# Patient Record
Sex: Female | Born: 1975 | Race: White | Hispanic: No | Marital: Married | Smoking: Former smoker
Health system: Southern US, Community
[De-identification: ages and names within clinical notes are randomized; demographics above are authoritative.]

## PROBLEM LIST (undated history)

## (undated) DIAGNOSIS — Z6839 Body mass index (BMI) 39.0-39.9, adult: Secondary | ICD-10-CM

## (undated) DIAGNOSIS — G43909 Migraine, unspecified, not intractable, without status migrainosus: Secondary | ICD-10-CM

## (undated) DIAGNOSIS — J189 Pneumonia, unspecified organism: Secondary | ICD-10-CM

## (undated) DIAGNOSIS — E039 Hypothyroidism, unspecified: Secondary | ICD-10-CM

## (undated) DIAGNOSIS — F32A Depression, unspecified: Secondary | ICD-10-CM

## (undated) DIAGNOSIS — Z87442 Personal history of urinary calculi: Secondary | ICD-10-CM

## (undated) DIAGNOSIS — F419 Anxiety disorder, unspecified: Secondary | ICD-10-CM

## (undated) DIAGNOSIS — R0601 Orthopnea: Secondary | ICD-10-CM

## (undated) DIAGNOSIS — M25472 Effusion, left ankle: Secondary | ICD-10-CM

## (undated) HISTORY — PX: LAPAROSCOPIC GASTRIC BANDING: SHX1100

## (undated) HISTORY — DX: Effusion, left ankle: M25.472

## (undated) HISTORY — DX: Body mass index (BMI) 39.0-39.9, adult: Z68.39

## (undated) HISTORY — DX: Orthopnea: R06.01

---

## 2020-11-06 NOTE — Progress Notes (Addendum)
COVID Vaccine Completed:  x2 Date COVID Vaccine completed:  Unsure of dates COVID vaccine manufacturer: Pfizer    Moderna   [employer]   PCP - Dr. Venetia Maxon at [facility] in [place] Cardiologist - N/A  Chest x-ray - N/A EKG - N/A Stress Test -  ECHO -  Cardiac Cath -  Pacemaker/ICD device last checked:  Sleep Study -  CPAP -   Fasting Blood Sugar -  Checks Blood Sugar _____ times a day  Blood Thinner Instructions: Aspirin Instructions: Last Dose:  Anesthesia review:    Patient denies shortness of breath, fever, cough and chest pain at PAT appointment.  Pt able to climb a flight of stairs and perform ADLs without assistance.   Patient verbalized understanding of instructions that were given to them at the PAT appointment. Patient was also instructed that they will need to review over the PAT instructions again at home before surgery.

## 2020-11-06 NOTE — Patient Instructions (Addendum)
DUE TO COVID-19 ONLY ONE VISITOR IS ALLOWED IN WAITING ROOM (VISITOR WILL HAVE A TEMPERATURE CHECK ON ARRIVAL AND MUST WEAR A FACE MASK THE ENTIRE TIME.)  ONCE YOU ARE ADMITTED TO YOUR PRIVATE ROOM, THE SAME ONE VISITOR IS ALLOWED TO VISIT DURING VISITING HOURS ONLY.  Your COVID swab testing is scheduled for Saturday, 11-11-20 at 12:10 PM ,   You must self quarantine after your testing per handout given to you at the testing site. [place] [address] [place], [place] [address]  (Must self quarantine after testing. Follow instructions on handout.)       Your procedure is scheduled on:  Wednesday, 11-15-20   Report to [place] [facility] AT 5:30 A. M.   Call this number if you have problems the morning of surgery:[phone].   OUR ADDRESS IS [name].  [place]   [place].                                     REMEMBER:  DO NOT EAT FOOD OR DRINK LIQUIDS AFTER MIDNIGHT .    BRUSH YOUR TEETH THE MORNING OF SURGERY.  TAKE THESE MEDICATIONS MORNING OF SURGERY WITH A SIP OF WATER:  Pepcid, Levothyroxine, Armour thyroid, Zonegran  DO NOT WEAR JEWERLY, MAKE UP, OR NAIL POLISH.  DO NOT WEAR LOTIONS, POWDERS, PERFUMES/COLOGNE OR DEODORANT.  DO NOT SHAVE FOR 24 HOURS PRIOR TO DAY OF SURGERY.  CONTACTS, GLASSES, OR DENTURES MAY NOT BE WORN TO SURGERY.                                    [facility] [facility] IS NOT RESPONSIBLE  FOR ANY BELONGINGS.           Incentive Spirometer  An incentive spirometer is a tool that can help keep your lungs clear and active. This tool measures how well you are filling your lungs with each breath. Taking long deep breaths may help reverse or decrease the chance of developing breathing (pulmonary) problems (especially infection) following:  A long period of time when you are unable to move or be active. BEFORE THE PROCEDURE   If the spirometer includes an indicator to show your best effort, your nurse or respiratory  therapist will set it to a desired goal.  If possible, sit up straight or lean slightly forward. Try not to slouch.  Hold the incentive spirometer in an upright position. INSTRUCTIONS FOR USE  1. Sit on the edge of your bed if possible, or sit up as far as you can in bed or on a chair. 2. Hold the incentive spirometer in an upright position. 3. Breathe out normally. 4. Place the mouthpiece in your mouth and seal your lips tightly around it. 5. Breathe in slowly and as deeply as possible, raising the piston or the ball toward the top of the column. 6. Hold your breath for 3-5 seconds or for as long as possible. Allow the piston or ball to fall to the bottom of the column. 7. Remove the mouthpiece from your mouth and breathe out normally. 8. Rest for a few seconds and repeat Steps 1 through 7 at least 10 times every 1-2 hours when you are awake. Take your time and take a few normal breaths between deep breaths. 9. The spirometer may include  an indicator to show your best effort. Use the indicator as a goal to work toward during each repetition. 10. After each set of 10 deep breaths, practice coughing to be sure your lungs are clear. If you have an incision (the cut made at the time of surgery), support your incision when coughing by placing a pillow or rolled up towels firmly against it. Once you are able to get out of bed, walk around indoors and cough well. You may stop using the incentive spirometer when instructed by your caregiver.  RISKS AND COMPLICATIONS  Take your time so you do not get dizzy or light-headed.  If you are in pain, you may need to take or ask for pain medication before doing incentive spirometry. It is harder to take a deep breath if you are having pain. AFTER USE  Rest and breathe slowly and easily.  It can be helpful to keep track of a log of your progress. Your caregiver can provide you with a simple table to help with this. If you are using the spirometer at home,  follow these instructions: [place] IF:   You are having difficultly using the spirometer.  You have trouble using the spirometer as often as instructed.  Your pain medication is not giving enough relief while using the spirometer.  You develop fever of 100.5 F (38.1 C) or higher. SEEK IMMEDIATE [facility]ARE IF:   You cough up bloody sputum that had not been present before.  You develop fever of 102 F (38.9 C) or greater.  You develop worsening pain at or near the incision site. MAKE SURE YOU:   Understand these instructions.  Will watch your condition.  Will get help right away if you are not doing well or get worse. Document Released: 03/24/2007 Document Revised: 02/03/2012 Document Reviewed: 05/25/2007 [facility] Patient Information 2014 [place], [place].   ________________________________________________________________________                                                      WHAT IS A BLOOD TRANSFUSION? Blood Transfusion Information  A transfusion is the replacement of blood or some of its parts. Blood is made up of multiple cells which provide different functions.  Red blood cells carry oxygen and are used for blood loss replacement.  White blood cells fight against infection.  Platelets control bleeding.  Plasma helps clot blood.  Other blood products are available for specialized needs, such as hemophilia or other clotting disorders. BEFORE THE TRANSFUSION  Who gives blood for transfusions?   Healthy volunteers who are fully evaluated to make sure their blood is safe. This is blood bank blood. Transfusion therapy is the safest it has ever been in the practice of medicine. Before blood is taken from a donor, a complete history is taken to make sure that person has no history of diseases nor engages in risky social behavior (examples are intravenous drug use or sexual activity with multiple partners). The donor's travel history is screened to  minimize risk of transmitting infections, such as malaria. The donated blood is tested for signs of infectious diseases, such as HIV and hepatitis. The blood is then tested to be sure it is compatible with you in order to minimize the chance of a transfusion reaction. If you or a relative donates blood, this is often done  in anticipation of surgery and is not appropriate for emergency situations. It takes many days to process the donated blood. RISKS AND COMPLICATIONS Although transfusion therapy is very safe and saves many lives, the main dangers of transfusion include:   Getting an infectious disease.  Developing a transfusion reaction. This is an allergic reaction to something in the blood you were given. Every precaution is taken to prevent this. The decision to have a blood transfusion has been considered carefully by your caregiver before blood is given. Blood is not given unless the benefits outweigh the risks. AFTER THE TRANSFUSION  Right after receiving a blood transfusion, you will usually feel much better and more energetic. This is especially true if your red blood cells have gotten low (anemic). The transfusion raises the level of the red blood cells which carry oxygen, and this usually causes an energy increase.  The nurse administering the transfusion will monitor you carefully for complications. HOME CARE INSTRUCTIONS  No special instructions are needed after a transfusion. You may find your energy is better. Speak with your caregiver about any limitations on activity for underlying diseases you may have. SEEK [facility] IF:   Your condition is not improving after your transfusion.  You develop redness or irritation at the intravenous (IV) site. SEEK IMMEDIATE [facility] CARE IF:  Any of the following symptoms occur over the next 12 hours:  Shaking chills.  You have a temperature by mouth above 102 F (38.9 C), not controlled by medicine.  Chest, back, or muscle  pain.  People around you feel you are not acting correctly or are confused.  Shortness of breath or difficulty breathing.  Dizziness and fainting.  You get a rash or develop hives.  You have a decrease in urine output.  Your urine turns a dark color or changes to pink, red, or brown. Any of the following symptoms occur over the next 10 days:  You have a temperature by mouth above 102 F (38.9 C), not controlled by medicine.  Shortness of breath.  Weakness after normal activity.  The white part of the eye turns yellow (jaundice).  You have a decrease in the amount of urine or are urinating less often.  Your urine turns a dark color or changes to pink, red, or brown. Document Released: 11/08/2000 Document Revised: 02/03/2012 Document Reviewed: 06/27/2008 [facility] Patient Information 2014 [place], [place].  _______________________________________________________________________

## 2020-11-08 ENCOUNTER — Encounter (HOSPITAL_COMMUNITY)
Admission: RE | Admit: 2020-11-08 | Discharge: 2020-11-08 | Disposition: A | Discharge status: Home / Self Care | Payer: Managed Care, Other (non HMO) | Source: Ambulatory Visit | Attending: Obstetrics and Gynecology | Admitting: Obstetrics and Gynecology

## 2020-11-08 ENCOUNTER — Encounter (HOSPITAL_COMMUNITY): Payer: Self-pay

## 2020-11-08 ENCOUNTER — Other Ambulatory Visit: Payer: Self-pay

## 2020-11-08 DIAGNOSIS — Z01812 Encounter for preprocedural laboratory examination: Secondary | ICD-10-CM | POA: Diagnosis not present

## 2020-11-08 HISTORY — DX: Hypothyroidism, unspecified: E03.9

## 2020-11-08 HISTORY — DX: Depression, unspecified: F32.A

## 2020-11-08 HISTORY — DX: Personal history of urinary calculi: Z87.442

## 2020-11-08 HISTORY — DX: Pneumonia, unspecified organism: J18.9

## 2020-11-08 HISTORY — DX: Migraine, unspecified, not intractable, without status migrainosus: G43.909

## 2020-11-08 HISTORY — DX: Anxiety disorder, unspecified: F41.9

## 2020-11-08 LAB — CBC
HCT: 46 % (ref 36.0–46.0)
Hemoglobin: 15.9 g/dL — ABNORMAL HIGH (ref 12.0–15.0)
MCH: 33.4 pg (ref 26.0–34.0)
MCHC: 34.6 g/dL (ref 30.0–36.0)
MCV: 96.6 fL (ref 80.0–100.0)
Platelets: 321 10*3/uL (ref 150–400)
RBC: 4.76 MIL/uL (ref 3.87–5.11)
RDW: 12.1 % (ref 11.5–15.5)
WBC: 5.9 10*3/uL (ref 4.0–10.5)
nRBC: 0 % (ref 0.0–0.2)

## 2020-11-11 ENCOUNTER — Other Ambulatory Visit (HOSPITAL_COMMUNITY)
Admission: RE | Admit: 2020-11-11 | Discharge: 2020-11-11 | Disposition: A | Discharge status: Home / Self Care | Payer: Managed Care, Other (non HMO) | Source: Ambulatory Visit | Attending: Obstetrics and Gynecology | Admitting: Obstetrics and Gynecology

## 2020-11-11 DIAGNOSIS — Z20822 Contact with and (suspected) exposure to covid-19: Secondary | ICD-10-CM | POA: Insufficient documentation

## 2020-11-11 DIAGNOSIS — Z01812 Encounter for preprocedural laboratory examination: Secondary | ICD-10-CM | POA: Insufficient documentation

## 2020-11-11 LAB — SARS CORONAVIRUS 2 (TAT 6-24 HRS): SARS Coronavirus 2: NEGATIVE

## 2020-11-14 NOTE — Anesthesia Preprocedure Evaluation (Addendum)
Anesthesia Evaluation  Patient identified by MRN, date of birth, ID band Patient awake    Reviewed: Allergy & Precautions, NPO status , Patient's Chart, lab work & pertinent test results  History of Anesthesia Complications Negative for: history of anesthetic complications  Airway Mallampati: II  TM Distance: >3 FB Neck ROM: Full    Dental  (+) Dental Advisory Given   Pulmonary former smoker,    Pulmonary exam normal        Cardiovascular negative cardio ROS Normal cardiovascular exam     Neuro/Psych  Headaches, PSYCHIATRIC DISORDERS Anxiety Depression    GI/Hepatic Neg liver ROS, GERD  Controlled and Medicated,  Endo/Other  Hypothyroidism  Obesity   Renal/GU negative Renal ROS     Musculoskeletal negative musculoskeletal ROS (+)   Abdominal   Peds  Hematology negative hematology ROS (+)   Anesthesia Other Findings Covid test negative   Reproductive/Obstetrics                            Anesthesia Physical Anesthesia Plan  ASA: II  Anesthesia Plan: General   Post-op Pain Management:    Induction: Intravenous  PONV Risk Score and Plan: 4 or greater and Treatment may vary due to age or medical condition, Ondansetron, Midazolam and Dexamethasone  Airway Management Planned: Oral ETT  Additional Equipment: None  Intra-op Plan:   Post-operative Plan: Extubation in OR  Informed Consent: I have reviewed the patients History and Physical, chart, labs and discussed the procedure including the risks, benefits and alternatives for the proposed anesthesia with the patient or authorized representative who has indicated his/her understanding and acceptance.     Dental advisory given  Plan Discussed with: CRNA and Anesthesiologist  Anesthesia Plan Comments:        Anesthesia Quick Evaluation

## 2020-11-14 NOTE — H&P (Signed)
  Gynecology History and Physical  S[place]by James is a 44 y.o. that presented with complaints of pelvic pain and bulk symptoms.  She underwent evaluation at an urgent care and CT revealed fibroid uterus.  Pelvic US was completed in the office and demonstrated the same, specifically fibroids measuring 5.3 x 4.3 cm, 2.4 x 1.9, and 3.3 x 2.9 cm. Adnexae with normal in appearance except for right-sided small simple resolving cyst.  Pain has been intermittent, and is worsened with onset of menses. Menses are heavy and occasionally irregular.   Most recent HGB 15.9 Most recent pap 10/06/2020: NILM with negative HPV  In her twenties, she underwent infertility workup and was never able to get pregnant.  She currently does not desire future childbearing potential. She was offered trial of medical treatment of fibroids, but desires surgical treatment.   She has a pertinent history of laparoscopic gastric banding in 2010, and hypothyroidism well controlled on levothyroxine as well as migraines. She has no known drug allergies and denies alcohol, tobacco, or other drug use.    Past Medical History:  Diagnosis Date  . Anxiety   . Depression   . History of kidney stones    Passed on its own  . Hypothyroidism   . Migraine headache   . Pneumonia    2019    Past Surgical History:  Procedure Laterality Date  . LAPAROSCOPIC GASTRIC BANDING      No family history on file.  Social History:  reports that she has quit smoking. She has a 20.00 pack-year smoking history. She has never used smokeless tobacco. She reports that she does not drink alcohol and does not use drugs.  Allergies: No Known Allergies  No medications prior to admission.    Review of Systems  There were no vitals taken for this visit. Physical Exam From office Visit 11/12/201: General: well appearing, no apparent distress Chest: nonlabored breathing CV: no peripheral edema Abdomen: nondistended, moderate tenderness in RLQ  quadrant. No apparent hernia. No rebound or guarding.  Pelvic: No vulvar lesions, no erythema. No vaginal lesions, no discharge. Cervix normal in appearance. Pap collected. Uterus bulky, size difficult to estimate due to limits with habitus. Midline, RLQ tenderness is elicited with bimanual exam, but not with isolated uterine motion. No adnexal masses palpated.  Chaperone present.  No results found for this or any previous visit (from the past 24 hour(s)).  No results found.  Assessment/Plan: Fibroid Uterus  - With persistent and worsening pelvic pain, heavy menses.  Discussed management of uterine fibroids, including medical and surgical means.  Patient declines medical management and desires hysterectomy.  - Plan for laparoscopic-assisted vaginal hysterectomy with bilateral salpingectomy.  Will retain ovaries if normal appearing. Patient agrees with approach.  Discussed risks of surgery, including but not limited to bleeding, infection, pain, need for revision, need for conversion to open procedure, need for blood transfusion.  - Discussed immediate and at-home recovery in detail in office  Carlyon Shadow 11/14/2020, 8:51 PM

## 2020-11-15 ENCOUNTER — Observation Stay (HOSPITAL_BASED_OUTPATIENT_CLINIC_OR_DEPARTMENT_OTHER): Payer: Managed Care, Other (non HMO) | Admitting: Anesthesiology

## 2020-11-15 ENCOUNTER — Observation Stay (HOSPITAL_BASED_OUTPATIENT_CLINIC_OR_DEPARTMENT_OTHER)
Admission: RE | Admit: 2020-11-15 | Discharge: 2020-11-16 | Disposition: A | Discharge status: Home / Self Care | Payer: Managed Care, Other (non HMO) | Attending: Obstetrics and Gynecology | Admitting: Obstetrics and Gynecology

## 2020-11-15 ENCOUNTER — Encounter (HOSPITAL_BASED_OUTPATIENT_CLINIC_OR_DEPARTMENT_OTHER)
Admission: RE | Disposition: A | Discharge status: Home / Self Care | Payer: Self-pay | Source: Home / Self Care | Attending: Obstetrics and Gynecology

## 2020-11-15 ENCOUNTER — Encounter (HOSPITAL_BASED_OUTPATIENT_CLINIC_OR_DEPARTMENT_OTHER): Payer: Self-pay | Admitting: Obstetrics and Gynecology

## 2020-11-15 DIAGNOSIS — E039 Hypothyroidism, unspecified: Secondary | ICD-10-CM | POA: Diagnosis not present

## 2020-11-15 DIAGNOSIS — N838 Other noninflammatory disorders of ovary, fallopian tube and broad ligament: Secondary | ICD-10-CM | POA: Diagnosis not present

## 2020-11-15 DIAGNOSIS — Z87891 Personal history of nicotine dependence: Secondary | ICD-10-CM | POA: Diagnosis not present

## 2020-11-15 DIAGNOSIS — N800 Endometriosis of uterus: Secondary | ICD-10-CM | POA: Insufficient documentation

## 2020-11-15 DIAGNOSIS — D259 Leiomyoma of uterus, unspecified: Principal | ICD-10-CM | POA: Diagnosis present

## 2020-11-15 DIAGNOSIS — N946 Dysmenorrhea, unspecified: Secondary | ICD-10-CM | POA: Diagnosis present

## 2020-11-15 HISTORY — PX: LAPAROSCOPIC VAGINAL HYSTERECTOMY WITH SALPINGECTOMY: SHX6680

## 2020-11-15 HISTORY — DX: Leiomyoma of uterus, unspecified: D25.9

## 2020-11-15 LAB — ABO/RH: ABO/RH(D): O POS

## 2020-11-15 LAB — TYPE AND SCREEN
ABO/RH(D): O POS
Antibody Screen: NEGATIVE

## 2020-11-15 LAB — POCT PREGNANCY, URINE: Preg Test, Ur: NEGATIVE

## 2020-11-15 SURGERY — HYSTERECTOMY, VAGINAL, LAPAROSCOPY-ASSISTED, WITH SALPINGECTOMY
Anesthesia: General | Site: Abdomen | Laterality: Bilateral

## 2020-11-15 MED ORDER — SODIUM CHLORIDE (PF) 0.9 % IJ SOLN
INTRAMUSCULAR | Status: AC
  Filled 2020-11-15: qty 10

## 2020-11-15 MED ORDER — GABAPENTIN 100 MG PO CAPS
ORAL_CAPSULE | ORAL | Status: AC
  Filled 2020-11-15: qty 1

## 2020-11-15 MED ORDER — OXYCODONE HCL 5 MG PO TABS
ORAL_TABLET | ORAL | Status: AC
  Filled 2020-11-15: qty 1

## 2020-11-15 MED ORDER — OXYCODONE HCL 5 MG PO TABS
ORAL_TABLET | ORAL | Status: AC
  Filled 2020-11-15: qty 2

## 2020-11-15 MED ORDER — PROPOFOL 10 MG/ML IV BOLUS
INTRAVENOUS | Status: DC | PRN
  Administered 2020-11-15: 200 mg via INTRAVENOUS

## 2020-11-15 MED ORDER — FENTANYL CITRATE (PF) 100 MCG/2ML IJ SOLN
INTRAMUSCULAR | Status: AC
  Filled 2020-11-15: qty 2

## 2020-11-15 MED ORDER — SCOPOLAMINE 1 MG/3DAYS TD PT72
1.0000 | MEDICATED_PATCH | TRANSDERMAL | Status: DC
  Administered 2020-11-15: 12:00:00 1.5 mg via TRANSDERMAL

## 2020-11-15 MED ORDER — ONDANSETRON HCL 4 MG/2ML IJ SOLN
INTRAMUSCULAR | Status: DC | PRN
  Administered 2020-11-15: 4 mg via INTRAVENOUS

## 2020-11-15 MED ORDER — HYDROMORPHONE HCL 1 MG/ML IJ SOLN: 0.5000 mg | INTRAMUSCULAR | Status: DC | PRN

## 2020-11-15 MED ORDER — DOCUSATE SODIUM 100 MG PO CAPS
100.0000 mg | ORAL_CAPSULE | Freq: Two times a day (BID) | ORAL | Status: DC
  Administered 2020-11-15: 22:00:00 100 mg via ORAL

## 2020-11-15 MED ORDER — ONDANSETRON HCL 4 MG PO TABS: 4.0000 mg | ORAL_TABLET | ORAL | Status: DC | PRN

## 2020-11-15 MED ORDER — CEFAZOLIN SODIUM-DEXTROSE 2-4 GM/100ML-% IV SOLN
2.0000 g | INTRAVENOUS | Status: AC
  Administered 2020-11-15: 08:00:00 2 g via INTRAVENOUS

## 2020-11-15 MED ORDER — MIDAZOLAM HCL 2 MG/2ML IJ SOLN
INTRAMUSCULAR | Status: DC | PRN
  Administered 2020-11-15: 2 mg via INTRAVENOUS

## 2020-11-15 MED ORDER — DEXMEDETOMIDINE (PRECEDEX) IN NS 20 MCG/5ML (4 MCG/ML) IV SYRINGE
PREFILLED_SYRINGE | INTRAVENOUS | Status: DC | PRN
  Administered 2020-11-15 (×4): 4 ug via INTRAVENOUS

## 2020-11-15 MED ORDER — DOCUSATE SODIUM 100 MG PO CAPS
ORAL_CAPSULE | ORAL | Status: AC
  Filled 2020-11-15: qty 1

## 2020-11-15 MED ORDER — VASOPRESSIN 20 UNIT/ML IV SOLN
INTRAVENOUS | Status: AC
  Filled 2020-11-15: qty 1

## 2020-11-15 MED ORDER — DEXAMETHASONE SODIUM PHOSPHATE 10 MG/ML IJ SOLN
INTRAMUSCULAR | Status: DC | PRN
  Administered 2020-11-15 (×2): 5 mg via INTRAVENOUS

## 2020-11-15 MED ORDER — PROMETHAZINE HCL 25 MG/ML IJ SOLN
6.2500 mg | INTRAMUSCULAR | Status: AC | PRN
Start: 2020-11-15 — End: 2020-11-15
  Administered 2020-11-15 (×2): 6.25 mg via INTRAVENOUS

## 2020-11-15 MED ORDER — CHLORHEXIDINE GLUCONATE 0.12 % MT SOLN: 15.0000 mL | Freq: Once | OROMUCOSAL | Status: DC

## 2020-11-15 MED ORDER — KETOROLAC TROMETHAMINE 30 MG/ML IJ SOLN
INTRAMUSCULAR | Status: DC | PRN
  Administered 2020-11-15: 30 mg via INTRAVENOUS

## 2020-11-15 MED ORDER — LACTATED RINGERS IV SOLN: INTRAVENOUS | Status: DC

## 2020-11-15 MED ORDER — ROCURONIUM BROMIDE 10 MG/ML (PF) SYRINGE
PREFILLED_SYRINGE | INTRAVENOUS | Status: AC
  Filled 2020-11-15: qty 10

## 2020-11-15 MED ORDER — SIMETHICONE 80 MG PO CHEW
80.0000 mg | CHEWABLE_TABLET | Freq: Four times a day (QID) | ORAL | Status: DC | PRN
  Administered 2020-11-15: 22:00:00 80 mg via ORAL

## 2020-11-15 MED ORDER — VASOPRESSIN 20 UNIT/ML IV SOLN
INTRAVENOUS | Status: DC | PRN
  Administered 2020-11-15: 09:00:00 10 mL via INTRAMUSCULAR

## 2020-11-15 MED ORDER — AMISULPRIDE (ANTIEMETIC) 5 MG/2ML IV SOLN
10.0000 mg | Freq: Once | INTRAVENOUS | Status: AC
  Administered 2020-11-15: 11:00:00 10 mg via INTRAVENOUS

## 2020-11-15 MED ORDER — MIDAZOLAM HCL 2 MG/2ML IJ SOLN
INTRAMUSCULAR | Status: AC
  Filled 2020-11-15: qty 2

## 2020-11-15 MED ORDER — KETOROLAC TROMETHAMINE 30 MG/ML IJ SOLN
INTRAMUSCULAR | Status: AC
  Filled 2020-11-15: qty 1

## 2020-11-15 MED ORDER — FENTANYL CITRATE (PF) 100 MCG/2ML IJ SOLN
INTRAMUSCULAR | Status: DC | PRN
  Administered 2020-11-15: 25 ug via INTRAVENOUS
  Administered 2020-11-15 (×2): 50 ug via INTRAVENOUS
  Administered 2020-11-15 (×2): 25 ug via INTRAVENOUS

## 2020-11-15 MED ORDER — WHITE PETROLATUM EX OINT
TOPICAL_OINTMENT | CUTANEOUS | Status: AC
  Filled 2020-11-15: qty 5

## 2020-11-15 MED ORDER — MENTHOL 3 MG MT LOZG: 1.0000 | LOZENGE | OROMUCOSAL | Status: DC | PRN

## 2020-11-15 MED ORDER — ALUM & MAG HYDROXIDE-SIMETH 200-200-20 MG/5ML PO SUSP: 30.0000 mL | ORAL | Status: DC | PRN

## 2020-11-15 MED ORDER — PROMETHAZINE HCL 25 MG/ML IJ SOLN
INTRAMUSCULAR | Status: AC
  Filled 2020-11-15: qty 1

## 2020-11-15 MED ORDER — PROPOFOL 10 MG/ML IV BOLUS
INTRAVENOUS | Status: AC
  Filled 2020-11-15: qty 20

## 2020-11-15 MED ORDER — CEFAZOLIN SODIUM-DEXTROSE 2-4 GM/100ML-% IV SOLN
INTRAVENOUS | Status: AC
  Filled 2020-11-15: qty 100

## 2020-11-15 MED ORDER — OXYCODONE HCL 5 MG/5ML PO SOLN: 5.0000 mg | Freq: Once | ORAL | Status: DC | PRN

## 2020-11-15 MED ORDER — ROCURONIUM BROMIDE 10 MG/ML (PF) SYRINGE
PREFILLED_SYRINGE | INTRAVENOUS | Status: DC | PRN
  Administered 2020-11-15: 20 mg via INTRAVENOUS
  Administered 2020-11-15: 50 mg via INTRAVENOUS
  Administered 2020-11-15 (×2): 20 mg via INTRAVENOUS
  Administered 2020-11-15: 10 mg via INTRAVENOUS

## 2020-11-15 MED ORDER — ONDANSETRON HCL 4 MG/2ML IJ SOLN
INTRAMUSCULAR | Status: AC
  Filled 2020-11-15: qty 2

## 2020-11-15 MED ORDER — TRAMADOL HCL 50 MG PO TABS: 50.0000 mg | ORAL_TABLET | Freq: Four times a day (QID) | ORAL | Status: DC | PRN

## 2020-11-15 MED ORDER — OXYCODONE HCL 5 MG PO TABS: 5.0000 mg | ORAL_TABLET | Freq: Once | ORAL | Status: DC | PRN

## 2020-11-15 MED ORDER — DEXTROSE-NACL 5-0.45 % IV SOLN: INTRAVENOUS | Status: DC

## 2020-11-15 MED ORDER — LIDOCAINE HCL (PF) 2 % IJ SOLN
INTRAMUSCULAR | Status: AC
  Filled 2020-11-15: qty 5

## 2020-11-15 MED ORDER — SIMETHICONE 80 MG PO CHEW
CHEWABLE_TABLET | ORAL | Status: AC
  Filled 2020-11-15: qty 1

## 2020-11-15 MED ORDER — FENTANYL CITRATE (PF) 100 MCG/2ML IJ SOLN
25.0000 ug | INTRAMUSCULAR | Status: DC | PRN
Start: 2020-11-15 — End: 2020-11-16
  Administered 2020-11-15: 25 ug via INTRAVENOUS

## 2020-11-15 MED ORDER — GABAPENTIN 100 MG PO CAPS
100.0000 mg | ORAL_CAPSULE | Freq: Three times a day (TID) | ORAL | Status: DC
  Administered 2020-11-15 (×2): 100 mg via ORAL

## 2020-11-15 MED ORDER — POVIDONE-IODINE 10 % EX SWAB: 2.0000 "application " | Freq: Once | CUTANEOUS | Status: DC

## 2020-11-15 MED ORDER — AMISULPRIDE (ANTIEMETIC) 5 MG/2ML IV SOLN
INTRAVENOUS | Status: AC
  Filled 2020-11-15: qty 2

## 2020-11-15 MED ORDER — ZOLPIDEM TARTRATE 5 MG PO TABS: 5.0000 mg | ORAL_TABLET | Freq: Every evening | ORAL | Status: DC | PRN

## 2020-11-15 MED ORDER — ORAL CARE MOUTH RINSE: 15.0000 mL | Freq: Once | OROMUCOSAL | Status: DC

## 2020-11-15 MED ORDER — LIDOCAINE 2% (20 MG/ML) 5 ML SYRINGE
INTRAMUSCULAR | Status: DC | PRN
  Administered 2020-11-15: 60 mg via INTRAVENOUS

## 2020-11-15 MED ORDER — DEXAMETHASONE SODIUM PHOSPHATE 10 MG/ML IJ SOLN
INTRAMUSCULAR | Status: AC
  Filled 2020-11-15: qty 1

## 2020-11-15 MED ORDER — OXYCODONE HCL 5 MG PO TABS
5.0000 mg | ORAL_TABLET | ORAL | Status: DC | PRN
  Administered 2020-11-15: 5 mg via ORAL
  Administered 2020-11-15: 10 mg via ORAL
  Administered 2020-11-15 (×2): 5 mg via ORAL
  Administered 2020-11-16 (×2): 10 mg via ORAL

## 2020-11-15 MED ORDER — SCOPOLAMINE 1 MG/3DAYS TD PT72
MEDICATED_PATCH | TRANSDERMAL | Status: AC
  Filled 2020-11-15: qty 1

## 2020-11-15 MED ORDER — SUGAMMADEX SODIUM 200 MG/2ML IV SOLN
INTRAVENOUS | Status: DC | PRN
  Administered 2020-11-15: 180 mg via INTRAVENOUS

## 2020-11-15 MED ORDER — DEXMEDETOMIDINE (PRECEDEX) IN NS 20 MCG/5ML (4 MCG/ML) IV SYRINGE
PREFILLED_SYRINGE | INTRAVENOUS | Status: AC
  Filled 2020-11-15: qty 5

## 2020-11-15 MED ORDER — BUPIVACAINE HCL (PF) 0.5 % IJ SOLN
INTRAMUSCULAR | Status: DC | PRN
  Administered 2020-11-15: 7 mL

## 2020-11-15 SURGICAL SUPPLY — 59 items
BLADE SURG SZ10 CARB STEEL (BLADE) ×2 IMPLANT
CABLE HIGH FREQUENCY MONO STRZ (ELECTRODE) IMPLANT
CATH ROBINSON RED A/P 16FR (CATHETERS) ×1 IMPLANT
COVER BACK TABLE 60X90IN (DRAPES) ×3 IMPLANT
COVER MAYO STAND STRL (DRAPES) ×3 IMPLANT
DECANTER SPIKE VIAL GLASS SM (MISCELLANEOUS) ×3 IMPLANT
DERMABOND ADVANCED (GAUZE/BANDAGES/DRESSINGS) ×2
DERMABOND ADVANCED .7 DNX12 (GAUZE/BANDAGES/DRESSINGS) ×1 IMPLANT
DRSG OPSITE POSTOP 3X4 (GAUZE/BANDAGES/DRESSINGS) ×3 IMPLANT
DURAPREP 26ML APPLICATOR (WOUND CARE) ×3 IMPLANT
ELECT REM PT RETURN 9FT ADLT (ELECTROSURGICAL) ×3
ELECTRODE REM PT RTRN 9FT ADLT (ELECTROSURGICAL) IMPLANT
FILTER SMOKE EVAC LAPAROSHD (FILTER) ×1 IMPLANT
GLOVE BIO SURGEON STRL SZ7 (GLOVE) ×2 IMPLANT
GLOVE BIOGEL PI IND STRL 7.0 (GLOVE) ×2 IMPLANT
GLOVE BIOGEL PI INDICATOR 7.0 (GLOVE) ×6
GLOVE ECLIPSE 6.5 STRL STRAW (GLOVE) ×2 IMPLANT
GLOVE INDICATOR 7.5 STRL GRN (GLOVE) ×3 IMPLANT
GLOVE SURG GAMMEX PI TX LF 6.5 (GLOVE) ×2 IMPLANT
GLOVE SURG LTX SZ7 (GLOVE) ×6 IMPLANT
GLOVE SURG SS PI 7.0 STRL IVOR (GLOVE) ×2 IMPLANT
GLOVE SURG SS PI 7.5 STRL IVOR (GLOVE) ×2 IMPLANT
GLOVE SURG UNDER POLY LF SZ6.5 (GLOVE) ×3 IMPLANT
GOWN STRL REUS W/ TWL LRG LVL3 (GOWN DISPOSABLE) IMPLANT
GOWN STRL REUS W/ TWL XL LVL3 (GOWN DISPOSABLE) ×2 IMPLANT
GOWN STRL REUS W/TWL LRG LVL3 (GOWN DISPOSABLE) ×12 IMPLANT
GOWN STRL REUS W/TWL XL LVL3 (GOWN DISPOSABLE)
IV CATH 18GX1.25 SAFE RETR GRN (IV SOLUTION) ×2 IMPLANT
KIT TURNOVER CYSTO (KITS) ×3 IMPLANT
LEGGING LITHOTOMY PAIR STRL (DRAPES) ×3 IMPLANT
LIGASURE BLUNT TIP 5 LONG 44CM (ELECTROSURGICAL) ×2 IMPLANT
LIGASURE IMPACT 36 18CM CVD LR (INSTRUMENTS) IMPLANT
LIGASURE VESSEL 5MM BLUNT TIP (ELECTROSURGICAL) ×3 IMPLANT
MARKER PEN SURG W/LABELS BLK (STERILIZATION PRODUCTS) ×2 IMPLANT
NS IRRIG 1000ML POUR BTL (IV SOLUTION) ×1 IMPLANT
NS IRRIG 500ML POUR BTL (IV SOLUTION) ×2 IMPLANT
PACK LAVH (CUSTOM PROCEDURE TRAY) ×3 IMPLANT
PACK TRENDGUARD 450 HYBRID PRO (MISCELLANEOUS) IMPLANT
PROTECTOR NERVE ULNAR (MISCELLANEOUS) ×2 IMPLANT
SCISSORS LAP 5X45 EPIX DISP (ENDOMECHANICALS) IMPLANT
SET CYSTO W/LG BORE CLAMP LF (SET/KITS/TRAYS/PACK) ×2 IMPLANT
SET IRRIG TUBING LAPAROSCOPIC (IRRIGATION / IRRIGATOR) IMPLANT
SET TUBE SMOKE EVAC HIGH FLOW (TUBING) ×3 IMPLANT
SPECIMEN JAR MEDIUM (MISCELLANEOUS) ×1 IMPLANT
SUT MNCRL 0 MO-4 VIOLET 18 CR (SUTURE) ×1 IMPLANT
SUT MNCRL 0 VIOLET 6X18 (SUTURE) ×1 IMPLANT
SUT MON AB 4-0 PS1 27 (SUTURE) ×3 IMPLANT
SUT MONOCRYL 0 6X18 (SUTURE)
SUT MONOCRYL 0 MO 4 18  CR/8 (SUTURE) ×4
SUT VIC AB 0 CT1 36 (SUTURE) ×2 IMPLANT
SUT VICRYL 0 UR6 27IN ABS (SUTURE) ×3 IMPLANT
SYR 5ML LL (SYRINGE) ×2 IMPLANT
TOWEL OR 17X26 10 PK STRL BLUE (TOWEL DISPOSABLE) ×4 IMPLANT
TRAY FOLEY W/BAG SLVR 14FR (SET/KITS/TRAYS/PACK) ×3 IMPLANT
TRENDGUARD 450 HYBRID PRO PACK (MISCELLANEOUS) ×3
TROCAR XCEL NON-BLD 11X100MML (ENDOMECHANICALS) ×3 IMPLANT
TROCAR XCEL NON-BLD 5MMX100MML (ENDOMECHANICALS) ×5 IMPLANT
UNDERPAD 30X36 HEAVY ABSORB (UNDERPADS AND DIAPERS) ×3 IMPLANT
WARMER LAPAROSCOPE (MISCELLANEOUS) ×3 IMPLANT

## 2020-11-15 NOTE — Transfer of Care (Signed)
Immediate Anesthesia Transfer of Care Note  Patient: [place] James  Procedure(s) Performed: Procedure(s) (LRB): LAPAROSCOPIC ASSISTED VAGINAL HYSTERECTOMY WITH BILATERAL SALPINGECTOMY (Bilateral)  Patient Location: PACU  Anesthesia Type: General  Level of Consciousness: awake, oriented, sedated and patient cooperative  Airway & Oxygen Therapy: Patient Spontanous Breathing and Patient connected to face mask oxygen  Post-op Assessment: Report given to PACU RN and Post -op Vital signs reviewed and stable  Post vital signs: Reviewed and stable  Complications: No apparent anesthesia complications Last Vitals:  Vitals Value Taken Time  BP 107/64 11/15/20 1015  Temp    Pulse 80 11/15/20 1016  Resp 13 11/15/20 1015  SpO2 98 % 11/15/20 1016  Vitals shown include unvalidated device data.  Last Pain:  Vitals:   11/15/20 0609  TempSrc: Oral  PainSc: 0-No pain      Patients Stated Pain Goal: 3 (67/54/49 2010)  Complications: No complications documented.

## 2020-11-15 NOTE — H&P (Signed)
Gynecology History and Physical  There are no changes to H&P.  Nela Gander is a 44 y.o. that presented with complaints of pelvic pain and bulk symptoms.  She underwent evaluation at an urgent care and CT revealed fibroid uterus.  Pelvic US was completed in the office and demonstrated the same, specifically fibroids measuring 5.3 x 4.3 cm, 2.4 x 1.9, and 3.3 x 2.9 cm. Adnexae with normal in appearance except for right-sided small simple resolving cyst.  Pain has been intermittent, and is worsened with onset of menses. Menses are heavy and occasionally irregular.   Most recent HGB 15.9 Most recent pap 10/06/2020: NILM with negative HPV  In her twenties, she underwent infertility workup and was never able to get pregnant.  She currently does not desire future childbearing potential. She was offered trial of medical treatment of fibroids, but desires surgical treatment.   She has a pertinent history of laparoscopic gastric banding in 2010, and hypothyroidism well controlled on levothyroxine as well as migraines. She has no known drug allergies and denies alcohol, tobacco, or other drug use.    Past Medical History:  Diagnosis Date  . Anxiety   . Depression   . History of kidney stones    Passed on its own  . Hypothyroidism   . Migraine headache   . Pneumonia    2019    Past Surgical History:  Procedure Laterality Date  . LAPAROSCOPIC GASTRIC BANDING      History reviewed. No pertinent family history.  Social History:  reports that she has quit smoking. She has a 20.00 pack-year smoking history. She has never used smokeless tobacco. She reports that she does not drink alcohol and does not use drugs.  Allergies: No Known Allergies  Medications Prior to Admission  Medication Sig Dispense Refill Last Dose  . amitriptyline (ELAVIL) 10 MG tablet Take 10 mg by mouth at bedtime.   11/14/2020 at Unknown time  . diazepam (VALIUM) 10 MG tablet Take 10 mg by mouth 2 (two) times daily as  needed for anxiety.   11/14/2020 at Unknown time  . escitalopram (LEXAPRO) 20 MG tablet Take 20 mg by mouth at bedtime.   11/15/2020 at 0430  . famotidine (PEPCID) 40 MG tablet Take 40 mg by mouth daily as needed.   Past Month at Unknown time  . levothyroxine (SYNTHROID) 150 MCG tablet Take 150 mcg by mouth daily before breakfast.   11/15/2020 at 0430  . rizatriptan (MAXALT) 10 MG tablet Take 10 mg by mouth 2 (two) times daily as needed for migraine.   Past Month at Unknown time  . thyroid (ARMOUR) 60 MG tablet Take 60 mg by mouth daily before breakfast.   11/15/2020 at 0430  . zonisamide (ZONEGRAN) 50 MG capsule Take 50 mg by mouth daily.   11/15/2020 at 0430    Review of Systems  Blood pressure 121/80, pulse 79, temperature (!) 97.5 F (36.4 C), temperature source Oral, resp. rate 16, height 5\' 3"  (1.6 m), weight 89.4 kg, last menstrual period 11/07/2020, SpO2 98 %. Physical Exam From office Visit 11/12/201: General: well appearing, no apparent distress Chest: nonlabored breathing CV: no peripheral edema Abdomen: nondistended, moderate tenderness in RLQ quadrant. No apparent hernia. No rebound or guarding.  Pelvic: No vulvar lesions, no erythema. No vaginal lesions, no discharge. Cervix normal in appearance. Pap collected. Uterus bulky, size difficult to estimate due to limits with habitus. Midline, RLQ tenderness is elicited with bimanual exam, but not with isolated uterine motion. No  adnexal masses palpated.  Chaperone present.  Results for orders placed or performed during the hospital encounter of 11/15/20 (from the past 24 hour(s))  Pregnancy, urine POC     Status: None   Collection Time: 11/15/20  5:54 AM  Result Value Ref Range   Preg Test, Ur NEGATIVE NEGATIVE    No results found.  Assessment/Plan: Fibroid Uterus  - With persistent and worsening pelvic pain, heavy menses.  Discussed management of uterine fibroids, including medical and surgical means.  Patient declines  medical management and desires hysterectomy.  - Plan for laparoscopic-assisted vaginal hysterectomy with bilateral salpingectomy.  Will retain ovaries if normal appearing. Patient agrees with approach.  Discussed risks of surgery, including but not limited to bleeding, infection, pain, need for revision, need for conversion to open procedure, need for blood transfusion.  - Discussed immediate and at-home recovery in detail in office  Carlyon Shadow 11/15/2020, 7:07 AM

## 2020-11-15 NOTE — Anesthesia Procedure Notes (Signed)
Procedure Name: Intubation Date/Time: 11/15/2020 7:37 AM Performed by: Suan Halter, CRNA Pre-anesthesia Checklist: Patient identified, Emergency Drugs available, Suction available and Patient being monitored Patient Re-evaluated:Patient Re-evaluated prior to induction Oxygen Delivery Method: Circle system utilized Preoxygenation: Pre-oxygenation with 100% oxygen Induction Type: IV induction Ventilation: Mask ventilation without difficulty Laryngoscope Size: Mac and 3 Grade View: Grade I Tube type: Oral Tube size: 7.0 mm Number of attempts: 1 Airway Equipment and Method: Stylet and Oral airway Placement Confirmation: ETT inserted through vocal cords under direct vision,  positive ETCO2 and breath sounds checked- equal and bilateral Secured at: 22 cm Tube secured with: Tape Dental Injury: Teeth and Oropharynx as per pre-operative assessment

## 2020-11-15 NOTE — Op Note (Signed)
OPERATIVE NOTE  PREOPERATIVE DIAGNOSES: 1. Fibroid uterus 2. Dysmenorrhea 3. Abnormal uterine bleeding  POSTOPERATIVE DIAGNOSES: 1. Same  PROCEDURE PERFORMED: Laparoscopic-assisted total vaginal hysterectomy with bilateral salpingectomy  SURGEON: Alpha Gula, MD ASSISTANT: Molli Posey, MD  ANESTHESIA: General   ESTIMATED BLOOD LOSS: 100 cc.  URINE OUTPUT: 200 cc of clear urine at the end of the procedure.  FLUIDS: 1200 cc of crystalloids.  COMPLICATIONS: None   TUBES: None.  DRAINS: Foley to gravity  PATHOLOGY: Uterus, cervix, bilateral fallopian tubes, were sent to pathology for review.  FINDINGS: On exam, under anesthesia, normal appearing vulva and vagina, uterus with multiple fibroids, the largest of which located in the posterior lower uterine segment. The bowel and omentum were normal appearing.  Procedure: A general anesthesia was induced and the patient was placed in the dirsal lithotomy position. The abdomen, perineum, and vagina were prepped and draped in the usual fashion. A foley catheter inserted into the bladder and attached to straight drainage. After the initial preparation, the procedure commenced at the vagina. With a speculum in place to visualize the cervix, the cervix was grasped and a jacobs tenaculum was placed within the uterine cavity for manipulation purposes being careful not to puncture the uterus. Attention was then turned to the abdomen.    Following infiltration with Marcaine, an infraumbilical incision was made and the entry was completed with direct visualization of 10 mm camera and trocar. With CO2 infiltration, visualization of the peritoneal cavity was then obtained and a brief inspection did not reveal any signs of complications from entry. Under direct observation, 46m suprapubic port was placed taking care to respect anatomical landmarks and vessels. Once the placement of the port was complete, the actual laparoscopic procedure began.    Beginning on the right side and distally along the length of the fallopean tube, the mesosalpinx was exposed by lifting the tube up towards the anterior abdominal wall. The mesosalpinx was then sequentially, clamped, ligated, and cut using the enseal working alongside the length of the tube and towards the cornua. This was left in place attached to cornua. The same process was repeated on the left, sequentially clamping, ligating, and cutting the mesosalpinx being sure to not injure the adjacent ovarian tissue or other surrounding structures. The round ligament was then ligated and cut. Following this, the anterior leaf of the broad ligament was then taken down on the left side, dissecting down towards the peritoneal reflection at the base of the bladder and adjacent to the cervix. The same process was then repeated on the left side such that both sides met and the anterior leaflet had been appropriately skeletonized.  Attention was then turned to the vagina. Jacobs tenaculum was removed.  A weighted speculum was placed in the posterior vaginal vault. The cervix was grasped with a teneculum on both its anterior and posterior lips. 20cc of pitressin was injected. With downward traction, we made a circumferential incision of the vaginal mucosa. This allowed dissection and entrance into the posterior cul-de-sac. Peritoneal entry was somewhat challenging due to positioon of large fibroid in the posterior lower uterine segment. The posterior peritoneum was sutured to the posterior vaginal cuff. At this time we visualized the uterosacral ligaments. These were clamped and ligated with #0 Monocryl suture. The cervicovesical space was then created by both blunt and sharp dissection, allowing uKoreato see the cardinal ligaments. These were clamped and ligated with #0 Monocryl suture as well. Once in the cervicovesical space, we were then able to completely  ligate the uterosacral-cardinal ligament complex with #0 Vicryl  suture. The anterior cul-de-sac was then entered sharply. Omentum and intestines were then visualized through the anterior cul-de-sac window and we began removal of the uterine body. The uterine arteries were then clamped and ligated with #0 Vicryl suture and carried upward toward the uterus until complete removal was obtained. Number 0 Monocryl suture was used on all major pedicles with Heaney clamping until the uterus was completely removed with the bilateral fallopian tubes and cervix. The right fallopian tube became detached from cornua during removal but was provided for specimen intact.  We then removed the weighted duckbill speculum and placed a regular speculum in the vaginal vault and visualized the entire area. We inspected for hemostasis, and this was secured. A modified McCall's culdoplasty was performed by placing a stitch through the uterosacral ligament on the left, purstringing the posterior peritoneum and then placing a stitch through the right uterosacral ligament and tying it all together. Hemostasis was again noted. The vaginal cuff was closed with #0 vicryl in a running fashion.   Attention then turned to the abdomen where all of the pedicles were noted to be hemostatic and cuff intact from above. Hemostasis was achieved - and confirmed on low flow. All gas removed from abdomen and all instruments were removed. Fascia was closed with 0' vicryl and skin closed with 4'0 monocryl. The sponge and lap counts were correct times 2 at this time.   The patient's procedure was terminated. We then awakened her. She was sent to the Recovery Room in good condition.

## 2020-11-15 NOTE — Brief Op Note (Signed)
11/15/2020  10:06 AM  PATIENT:  Shelby James  44 y.o. female  PRE-OPERATIVE DIAGNOSIS:  DYSMENORRHEA  POST-OPERATIVE DIAGNOSIS:  DYSMENORRHEA  PROCEDURE:  Procedure(s): LAPAROSCOPIC ASSISTED VAGINAL HYSTERECTOMY WITH BILATERAL SALPINGECTOMY (Bilateral)  SURGEON:  Surgeon(s) and Role:    Mardelle Matte, Michell Heinrich, MD - Primary    * Molli Posey, MD - Assisting   ANESTHESIA:   general  EBL:  100 mL   BLOOD ADMINISTERED:none  DRAINS: none   LOCAL MEDICATIONS USED:  BUPIVICAINE   SPECIMEN:  Uterine, cervix bilateral fallopian tubes  DISPOSITION OF SPECIMEN:  PATHOLOGY  COUNTS:  YES  TOURNIQUET:  * No tourniquets in log *  DICTATION: .Note written in EPIC  PLAN OF CARE: Admit for overnight observation  PATIENT DISPOSITION:  PACU - hemodynamically stable.   Delay start of Pharmacological VTE agent (>24hrs) due to surgical blood loss or risk of bleeding: not applicable

## 2020-11-15 NOTE — Anesthesia Postprocedure Evaluation (Signed)
Anesthesia Post Note  Patient: Rufus Popovich  Procedure(s) Performed: LAPAROSCOPIC ASSISTED VAGINAL HYSTERECTOMY WITH BILATERAL SALPINGECTOMY (Bilateral Abdomen)     Patient location during evaluation: PACU Anesthesia Type: General Level of consciousness: awake and alert Pain management: pain level controlled Vital Signs Assessment: post-procedure vital signs reviewed and stable Respiratory status: spontaneous breathing, nonlabored ventilation and respiratory function stable Cardiovascular status: blood pressure returned to baseline and stable Postop Assessment: no apparent nausea or vomiting Anesthetic complications: no   No complications documented.  Last Vitals:  Vitals:   11/15/20 1015 11/15/20 1030  BP: 107/64 113/62  Pulse: 75 67  Resp: 18 20  Temp: 36.5 C   SpO2: 99% 97%    Last Pain:  Vitals:   11/15/20 1045  TempSrc:   PainSc: Onley Silverio Hagan

## 2020-11-16 DIAGNOSIS — D259 Leiomyoma of uterus, unspecified: Secondary | ICD-10-CM | POA: Diagnosis not present

## 2020-11-16 LAB — CBC
HCT: 37.2 % (ref 36.0–46.0)
Hemoglobin: 12.9 g/dL (ref 12.0–15.0)
MCH: 34 pg (ref 26.0–34.0)
MCHC: 34.7 g/dL (ref 30.0–36.0)
MCV: 98.2 fL (ref 80.0–100.0)
Platelets: 293 10*3/uL (ref 150–400)
RBC: 3.79 MIL/uL — ABNORMAL LOW (ref 3.87–5.11)
RDW: 12.4 % (ref 11.5–15.5)
WBC: 8.5 10*3/uL (ref 4.0–10.5)
nRBC: 0 % (ref 0.0–0.2)

## 2020-11-16 LAB — SURGICAL PATHOLOGY

## 2020-11-16 MED ORDER — OXYCODONE HCL 5 MG PO TABS
5.0000 mg | ORAL_TABLET | ORAL | 0 refills | Status: DC | PRN
Start: 2020-11-16 — End: 2023-07-29

## 2020-11-16 MED ORDER — OXYCODONE HCL 5 MG PO TABS
ORAL_TABLET | ORAL | Status: AC
  Filled 2020-11-16: qty 2

## 2020-11-16 MED ORDER — ACETAMINOPHEN 325 MG PO TABS
650.0000 mg | ORAL_TABLET | Freq: Four times a day (QID) | ORAL | 0 refills | Status: DC | PRN
Start: 2020-11-16 — End: 2023-07-29

## 2020-11-16 NOTE — Progress Notes (Signed)
Postoperative Progress Note  Shelby James is a 44 y.o. G0 that is POD#0 s/p laparoscopic assisted vaginal hysterectomy with bilateral salpingectomy.  S:Overnight, patient reports no acute events. She had immediate postoperative nausea that was resolved with medication. She reports well controlled pain, is ambulating without difficulty, voiding spontaneously, tolerating regular diet without nausea or vomiting.  She has yet to pass flatus.  Denies distension, vaginal bleeding.  O: Vitals:   11/15/20 1735 11/15/20 2158 11/16/20 0210 11/16/20 0615  BP: 107/70 110/70 105/60 104/63  Pulse: 90 97 98 90  Resp: 18 16 16 20   Temp: 98.1 F (36.7 C) 97.7 F (36.5 C) 98.2 F (36.8 C) 97.9 F (36.6 C)  TempSrc:      SpO2: 97% 97% 98% 97%  Weight:      Height:       Gen: alert, no distress Chest: nonlabored breaths CV: no peripheral edema Ext: no signs of DVT Abdomen: soft, nondistended, mild TTP diffusely Incision: 2x port sites with skin glue  A/P: Postoperative day 1, doing well.  Appropriate for discharge today with follow up in office. All questions answered.

## 2020-11-20 ENCOUNTER — Encounter (HOSPITAL_BASED_OUTPATIENT_CLINIC_OR_DEPARTMENT_OTHER): Payer: Self-pay | Admitting: Obstetrics and Gynecology

## 2020-11-20 NOTE — Discharge Summary (Signed)
Discharge Summary  Shelby James is a 44 y.o. female that presented on 11/15/2020 for scheduled laparoscopic assisted vaginal hysterectomy with bilateral salpingectomy.  Her procedure was uncomplicated.  She remained for overnight observation and did not have any postoperative complications except for immediate postoperative nausea that was relieved with medications. On POD#1, she reports well controlled pain, was ambulating without difficulty, voiding spontaneously, tolerating regular diet, and desired discharge home. She was discharged home on POD#1 in stable condition on 11/16/2020 with plans for in office follow up.   Hemoglobin  Date Value Ref Range Status  11/16/2020 12.9 12.0 - 15.0 g/dL Final   HCT  Date Value Ref Range Status  11/16/2020 37.2 36.0 - 46.0 % Final    Physical Exam:  Gen: alert, no distress Chest: nonlabored breaths CV: no peripheral edema Ext: no signs of DVT Abdomen: soft, nondistended, mild TTP diffusely Incision: 2x port sites with skin glue  Discharge Diagnoses: s/p LAVH BS  Discharge Information: Date: 11/20/2020 Activity: light activity.  No heavy lifting. Pelvic rest.  Diet: routine Medications: tylenol, oxycodone.  Condition: stable Instructions: refer to practice specific booklet Discharge to: home  Follow-up Information    [facility] Follow up.   Contact information: [address] [place] [address] [phone]               Lyn Henri 11/20/2020, 8:36 AM

## 2021-01-31 ENCOUNTER — Other Ambulatory Visit: Payer: Self-pay | Admitting: Obstetrics and Gynecology

## 2021-01-31 DIAGNOSIS — R928 Other abnormal and inconclusive findings on diagnostic imaging of breast: Secondary | ICD-10-CM

## 2021-02-16 ENCOUNTER — Ambulatory Visit
Admission: RE | Admit: 2021-02-16 | Discharge: 2021-02-16 | Disposition: A | Discharge status: Home / Self Care | Payer: Managed Care, Other (non HMO) | Source: Ambulatory Visit | Attending: Obstetrics and Gynecology | Admitting: Obstetrics and Gynecology

## 2021-02-16 ENCOUNTER — Other Ambulatory Visit: Payer: Self-pay

## 2021-02-16 DIAGNOSIS — R928 Other abnormal and inconclusive findings on diagnostic imaging of breast: Secondary | ICD-10-CM

## 2022-02-22 ENCOUNTER — Other Ambulatory Visit: Payer: Self-pay | Admitting: Rheumatology

## 2022-02-22 DIAGNOSIS — M461 Sacroiliitis, not elsewhere classified: Secondary | ICD-10-CM

## 2022-03-14 ENCOUNTER — Ambulatory Visit
Admission: RE | Admit: 2022-03-14 | Discharge: 2022-03-14 | Disposition: A | Discharge status: Home / Self Care | Payer: 59 | Source: Ambulatory Visit | Attending: Rheumatology | Admitting: Rheumatology

## 2022-03-14 DIAGNOSIS — M461 Sacroiliitis, not elsewhere classified: Secondary | ICD-10-CM

## 2022-03-14 MED ORDER — GADOBENATE DIMEGLUMINE 529 MG/ML IV SOLN
18.0000 mL | Freq: Once | INTRAVENOUS | Status: AC | PRN
Start: 2022-03-14 — End: 2022-03-14
  Administered 2022-03-14: 18 mL via INTRAVENOUS

## 2023-01-14 ENCOUNTER — Other Ambulatory Visit: Payer: Self-pay | Admitting: Student

## 2023-01-14 DIAGNOSIS — G35 Multiple sclerosis: Secondary | ICD-10-CM

## 2023-02-02 ENCOUNTER — Inpatient Hospital Stay: Admission: RE | Admit: 2023-02-02 | Payer: 59 | Source: Ambulatory Visit

## 2023-02-02 ENCOUNTER — Other Ambulatory Visit: Payer: 59

## 2023-05-01 IMAGING — MR MR SACRUM / SI JOINTS WO/W CM
4 of 9 series · 27 of 48 positions shown · IV contrast (18ml Multihance)
Comparison: CT abdomen/pelvis 10/01/2020

CLINICAL DATA: Sacroiliitis

EXAM:
MRI LUMBAR SPINE WITHOUT AND WITH CONTRAST
TECHNIQUE: Multiplanar and multiecho pulse sequences of the lumbar spine were
obtained without and with intravenous contrast.
CONTRAST:  18mL MULTIHANCE GADOBENATE DIMEGLUMINE 529 MG/ML IV SOLN

[Series 5: T2 fat-sat · sagittal · 4.0mm · 0.75mm/px · 6 of 36 slices shown (1 of 2)]
[im 1/36]
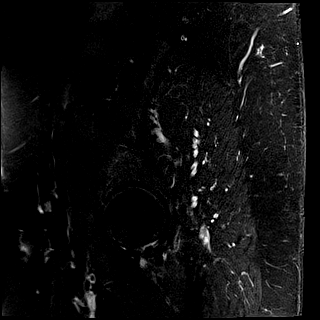
[im 8/36]
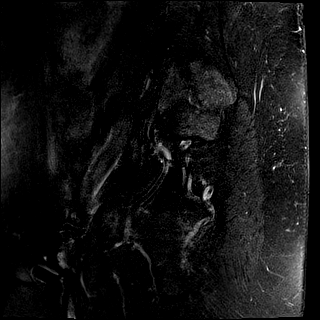
[im 15/36]
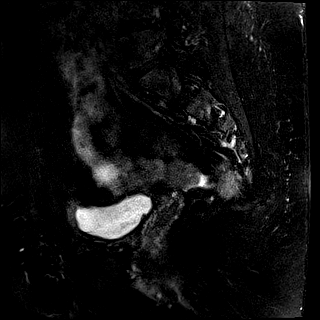
[im 22/36]
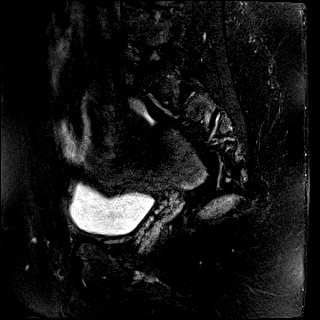
[im 29/36]
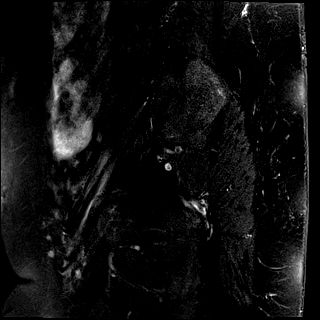
[im 36/36]
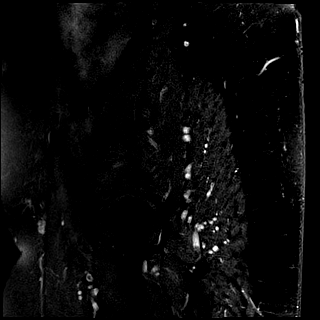

[Series 6: T1 · axial · 4.0mm · 0.59mm/px · z∈[-59,+176]mm · 7 of 48 slices shown]
[im 1/48]
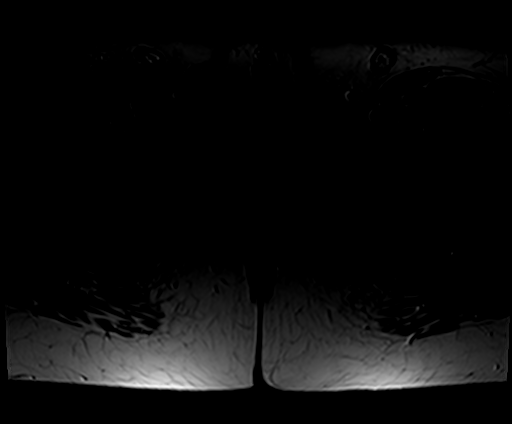
[im 8/48]
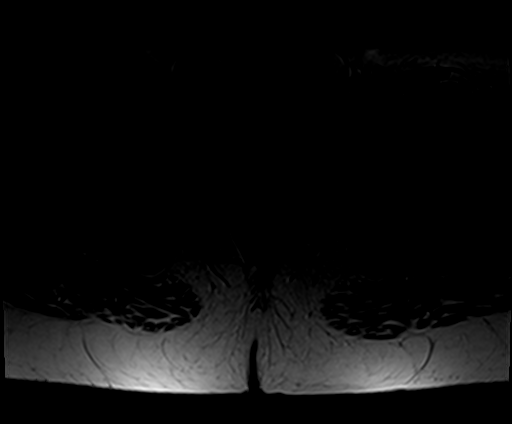
[im 16/48]
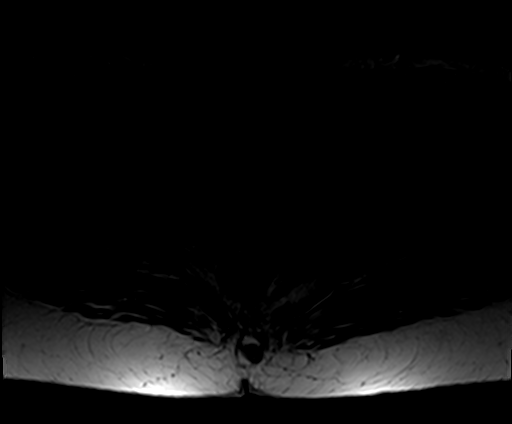
[im 24/48]
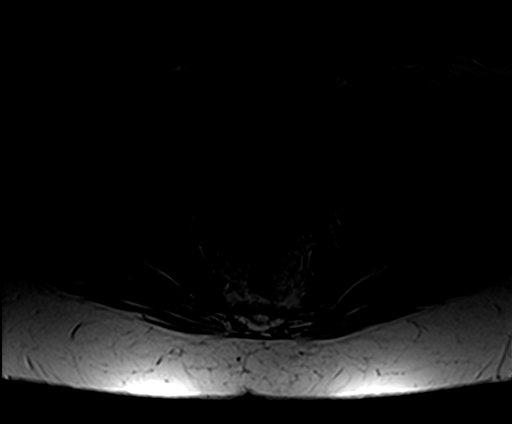
[im 32/48]
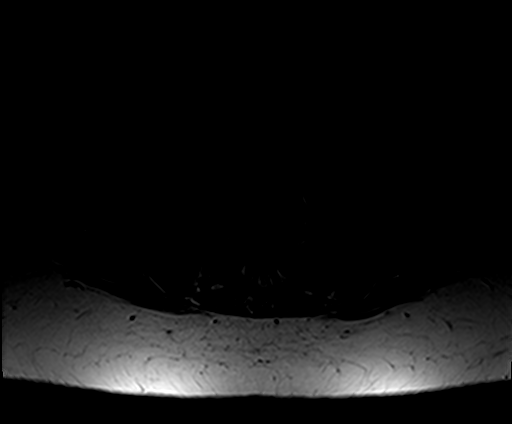
[im 40/48]
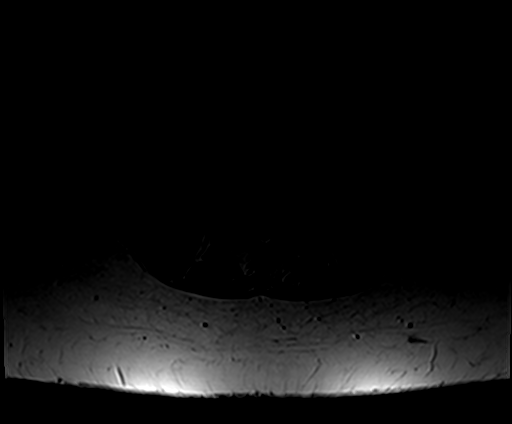
[im 48/48]
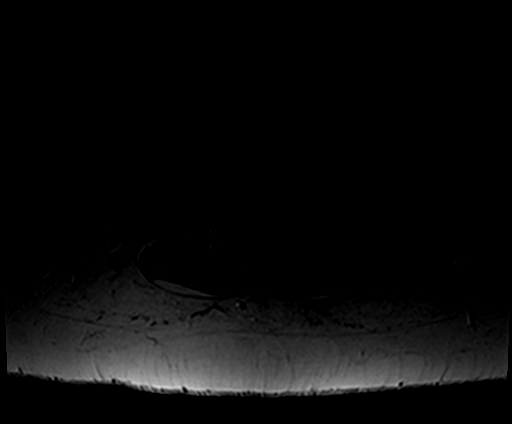

[Series 7: T2 fat-sat · axial · 4.0mm · 0.59mm/px · z∈[-59,+176]mm · 7 of 48 slices shown (2 of 2)]
[im 1/48]
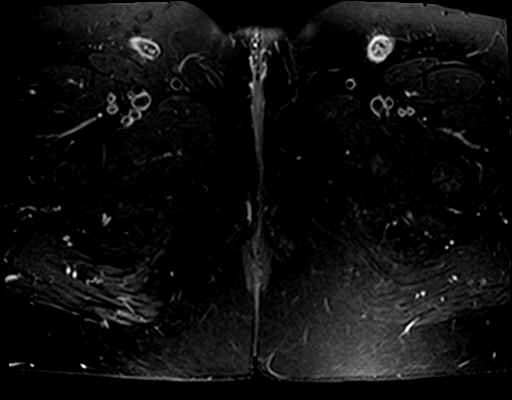
[im 8/48]
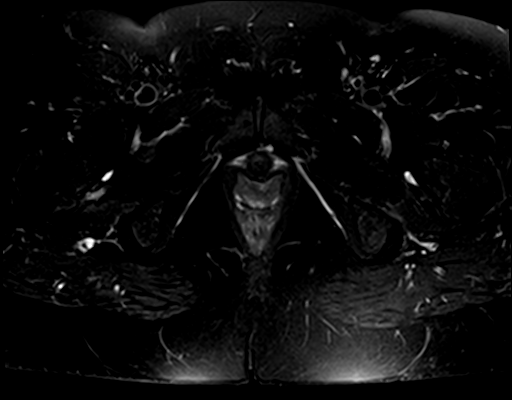
[im 16/48]
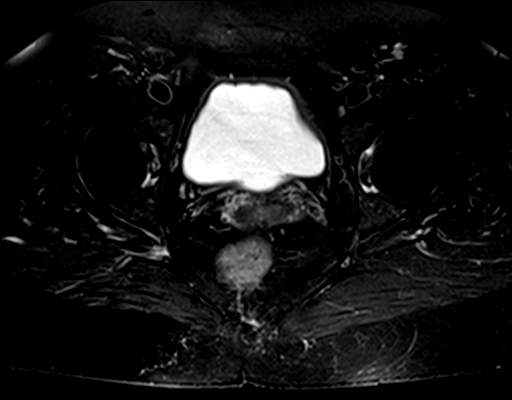
[im 24/48]
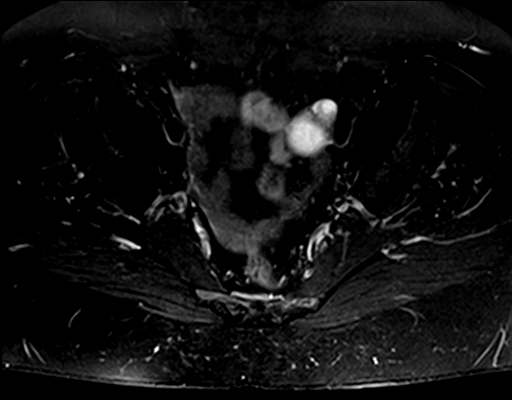
[im 32/48]
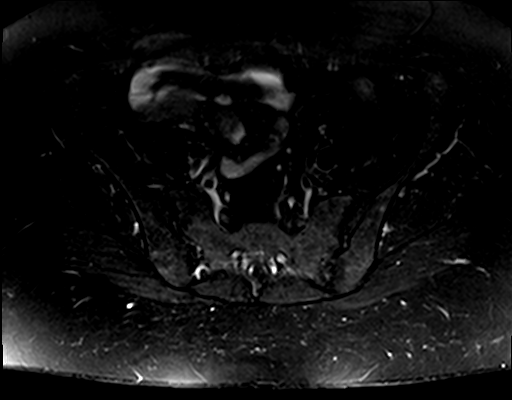
[im 40/48]
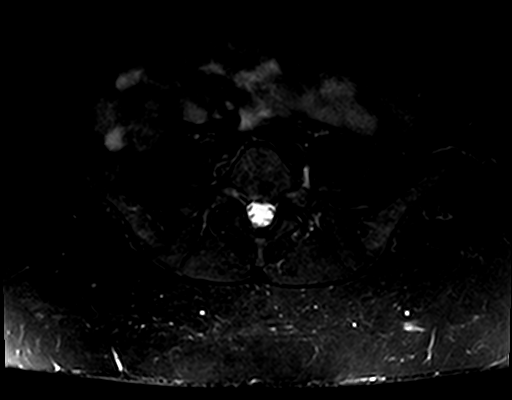
[im 48/48]
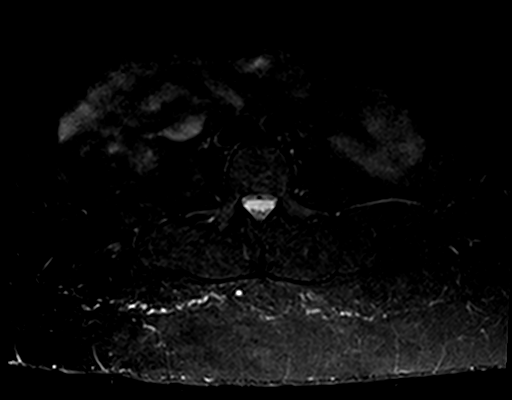

[Series 8: T1 fat-sat · axial · non-contrast · 4.0mm · 0.59mm/px · z∈[-59,+176]mm · 7 of 48 slices shown]
[im 1/48]
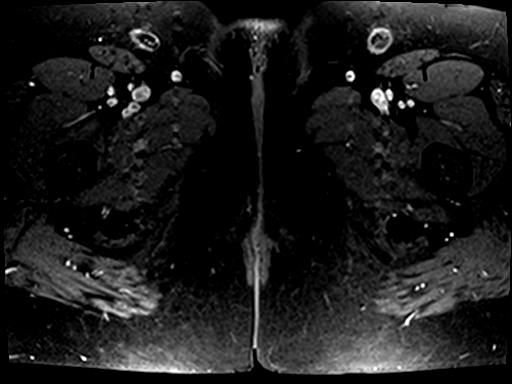
[im 8/48]
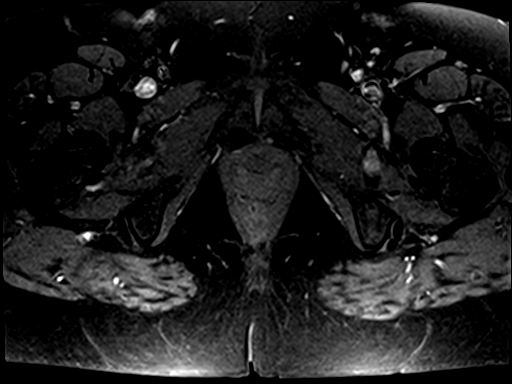
[im 16/48]
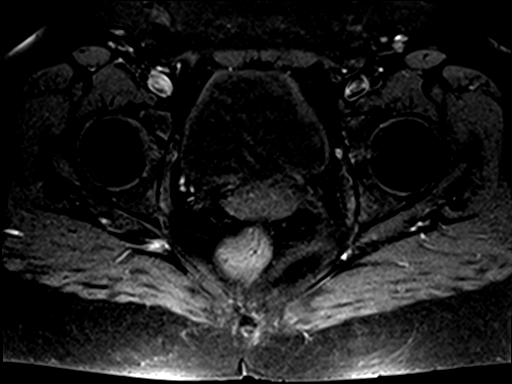
[im 24/48]
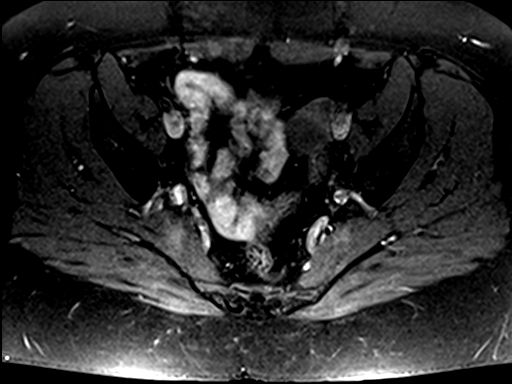
[im 32/48]
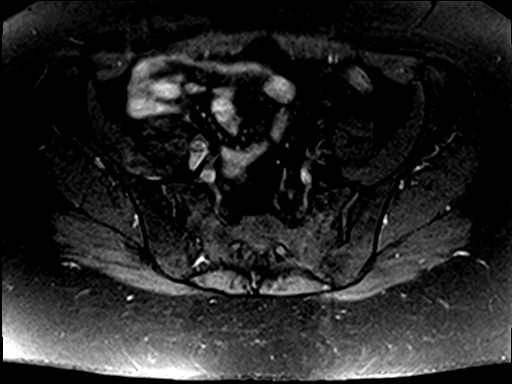
[im 40/48]
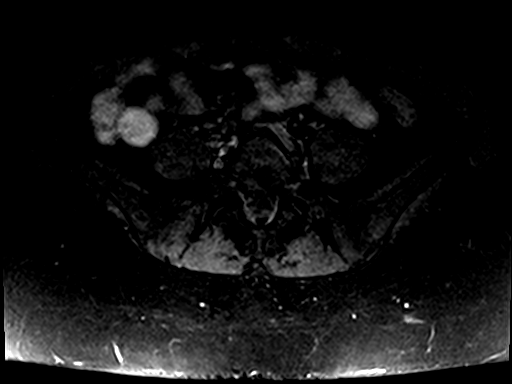
[im 48/48]
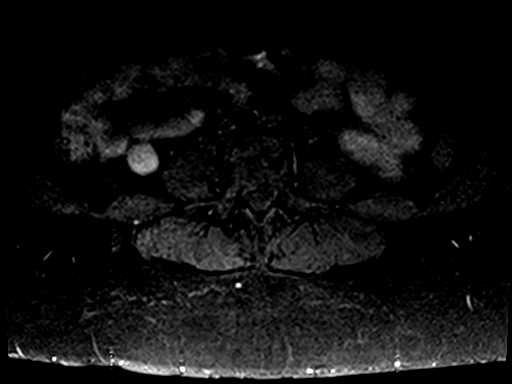

[27 of 48 positions shown; findings below may reference images not displayed]

FINDINGS: Bones/Joint/Cartilage

No fracture or dislocation. Normal alignment. No joint effusion. No
marrow signal abnormality.

No SI joint widening or erosive changes. No subchondral reactive
marrow changes. No SI joint effusion. Tiny anterior inferior
marginal osteophytes and subchondral sclerosis likely reflecting
mild early osteoarthritis.

At L4-5 there is a mild broad-based disc bulge. At L5-S1 there is a
small central annular fissure.

Ligaments, Muscles and Tendons

Muscles are normal. No muscle atrophy. No muscle edema. Piriformis
muscles are normal bilaterally without signal abnormality.

Soft tissue
No fluid collection or hematoma. No soft tissue mass. Normal
neurovascular bundles.
IMPRESSION: 1. Mild early osteoarthritis of the SI joints bilaterally. No
evidence of an infectious or inflammatory sacroiliitis.

## 2023-07-29 ENCOUNTER — Other Ambulatory Visit: Payer: Self-pay

## 2023-07-29 DIAGNOSIS — R0601 Orthopnea: Secondary | ICD-10-CM | POA: Insufficient documentation

## 2023-07-29 DIAGNOSIS — J189 Pneumonia, unspecified organism: Secondary | ICD-10-CM | POA: Insufficient documentation

## 2023-07-29 DIAGNOSIS — E039 Hypothyroidism, unspecified: Secondary | ICD-10-CM | POA: Insufficient documentation

## 2023-07-29 DIAGNOSIS — Z87442 Personal history of urinary calculi: Secondary | ICD-10-CM | POA: Insufficient documentation

## 2023-07-29 DIAGNOSIS — Z6839 Body mass index (BMI) 39.0-39.9, adult: Secondary | ICD-10-CM | POA: Insufficient documentation

## 2023-07-29 DIAGNOSIS — G43909 Migraine, unspecified, not intractable, without status migrainosus: Secondary | ICD-10-CM | POA: Insufficient documentation

## 2023-07-29 DIAGNOSIS — F419 Anxiety disorder, unspecified: Secondary | ICD-10-CM | POA: Insufficient documentation

## 2023-07-29 DIAGNOSIS — F32A Depression, unspecified: Secondary | ICD-10-CM | POA: Insufficient documentation

## 2023-07-29 DIAGNOSIS — M25472 Effusion, left ankle: Secondary | ICD-10-CM | POA: Insufficient documentation

## 2023-07-31 ENCOUNTER — Ambulatory Visit: Payer: 59 | Attending: Cardiology | Admitting: Cardiology

## 2023-07-31 ENCOUNTER — Encounter: Payer: Self-pay | Admitting: Cardiology

## 2023-07-31 ENCOUNTER — Telehealth: Payer: Self-pay | Admitting: Cardiology

## 2023-07-31 VITALS — BP 114/70 | HR 74 | Ht 63.0 in | Wt 228.0 lb

## 2023-07-31 DIAGNOSIS — R011 Cardiac murmur, unspecified: Secondary | ICD-10-CM

## 2023-07-31 DIAGNOSIS — R600 Localized edema: Secondary | ICD-10-CM | POA: Diagnosis not present

## 2023-07-31 DIAGNOSIS — R0602 Shortness of breath: Secondary | ICD-10-CM | POA: Diagnosis not present

## 2023-07-31 DIAGNOSIS — M25472 Effusion, left ankle: Secondary | ICD-10-CM

## 2023-07-31 NOTE — Telephone Encounter (Signed)
Normal results per Dr. Tomie China. Pt verbalized understanding and had no additional questions. Routed to PCP.

## 2023-07-31 NOTE — Telephone Encounter (Signed)
Pt called in stating she had a stat ultrasound today and wants to know if the results are in

## 2023-07-31 NOTE — Progress Notes (Signed)
Cardiology Office Note:    Date:  07/31/2023   ID:  Shelby James, DOB 1976/07/12, MRN [id number]  PCP:  Street, Stephanie Coup, MD  Cardiologist:  Garwin Brothers, MD   Referring MD: [address], Stephanie Coup, *    ASSESSMENT:    1. Shortness of breath   2. Edema, lower extremity   3. Pedal edema   4. Edema of left ankle   5. Morbid obesity (HCC)   6. Cardiac murmur    PLAN:    In order of problems listed above:  Primary prevention stressed with the patient.  Importance of compliance with diet medication stressed and patient verbalized standing. Bilateral pedal edema left greater than right: I am a little concerned about this and we will send her for a stat DVT study.  I want to rule out DVT Shortness of breath: I discussed this with her at extensive length.  It has not changed.  She tells me that she leads a sedentary lifestyle and her shortness of breath has not worsened.  She mentions to me that she is walking 10 minutes of brisk walking every hour for a couple of hours and with this she had no symptoms or any significant changes. Morbid obesity: Weight reduction stressed diet emphasized and she promises to do better.  Risks of obesity explained. Cardiac murmur: Echocardiogram will be done to assess murmur heard on auscultation. Patient will be seen in follow-up appointment in 6 months or earlier if the patient has any concerns.    Medication Adjustments/Labs and Tests Ordered: Current medicines are reviewed at length with the patient today.  Concerns regarding medicines are outlined above.  Orders Placed This Encounter  Procedures   EKG 12-Lead   VAS [place] LOWER EXTREMITY VENOUS (DVT)   No orders of the defined types were placed in this encounter.    History of Present Illness:    Shelby James is a 47 y.o. female who is being seen today for the evaluation of pedal edema left greater than right at the request of Street, Stephanie Coup, *.  Patient is a pleasant  47 year old female.  She has past medical history of morbid obesity.  She leads a sedentary lifestyle.  She is getting active.  She tells me she walks 10 minutes of brisk walking 10 to 15 minutes brisk without any symptoms.  She has baseline shortness of breath which has not changed over the past several months.  She attributes it to a sedentary lifestyle.  At the time of my evaluation, the patient is alert awake oriented and in no distress.  Past Medical History:  Diagnosis Date   Anxiety    BMI 39.0-39.9,adult    Depression    Edema of left ankle    Fibroid uterus 11/15/2020   History of kidney stones    Passed on its own   Hypothyroidism    Migraine headache    Orthopnea    Pneumonia    2019    Past Surgical History:  Procedure Laterality Date   LAPAROSCOPIC GASTRIC BANDING     LAPAROSCOPIC VAGINAL HYSTERECTOMY WITH SALPINGECTOMY Bilateral 11/15/2020   Procedure: LAPAROSCOPIC ASSISTED VAGINAL HYSTERECTOMY WITH BILATERAL SALPINGECTOMY;  Surgeon: Lyn Henri, MD;  [facility];  Service: Gynecology;  Laterality: Bilateral;    Current Medications: Current Meds  Medication Sig   clobetasol (TEMOVATE) 0.05 % external solution Apply 1 Application topically 2 (two) times daily.   diazepam (VALIUM) 10 MG tablet Take 5-10 mg by mouth  at bedtime as needed for anxiety or sleep.   Erenumab-aooe (AIMOVIG) 70 MG/ML SOAJ Inject 70 mg into the skin every 30 (thirty) days.   escitalopram (LEXAPRO) 20 MG tablet Take 20 mg by mouth at bedtime.   famotidine (PEPCID) 40 MG tablet Take 40 mg by mouth 2 (two) times daily as needed for heartburn or indigestion.   hydroxychloroquine (PLAQUENIL) 200 MG tablet Take 200 mg by mouth daily.   levothyroxine (SYNTHROID) 175 MCG tablet Take 175 mcg by mouth daily.   NURTEC 75 MG TBDP Take 1 tablet by mouth every other day.   thyroid (ARMOUR) 60 MG tablet Take 60 mg by mouth daily before breakfast.   torsemide (DEMADEX) 5 MG  tablet Take 5 mg by mouth daily as needed (fluid or edema).   Vitamin D, Ergocalciferol, (DRISDOL) 1.25 MG (50000 UNIT) CAPS capsule Take 50,000 Units by mouth once a week.     Allergies:   Patient has no known allergies.   Social History   Socioeconomic History   Marital status: Married    Spouse name: Not on file   Number of children: Not on file   Years of education: Not on file   Highest education level: Not on file  Occupational History   Not on file  Tobacco Use   Smoking status: Former    Current packs/day: 1.00    Average packs/day: 1 pack/day for 20.0 years (20.0 ttl pk-yrs)    Types: Cigarettes   Smokeless tobacco: Never   Tobacco comments:    Quit 2010  Vaping Use   Vaping status: Never Used  Substance and Sexual Activity   Alcohol use: Never   Drug use: Never   Sexual activity: Not on file  Other Topics Concern   Not on file  Social History Narrative   Not on file   Social Determinants of Health   Financial Resource Strain: Not on file  Food Insecurity: Not on file  Transportation Needs: Not on file  Physical Activity: Not on file  Stress: Not on file  Social Connections: Not on file     Family History: The patient's family history includes Leukemia in her father; Lupus in her paternal grandfather.  ROS:   Please see the history of present illness.    All other systems reviewed and are negative.  EKGs/Labs/Other Studies Reviewed:    The following studies were reviewed today:  EKG Interpretation Date/Time:  Thursday July 31 2023 09:51:07 EDT Ventricular Rate:  74 PR Interval:  142 QRS Duration:  86 QT Interval:  378 QTC Calculation: 419 R Axis:   21  Text Interpretation: Normal sinus rhythm Right atrial enlargement Cannot rule out Anterior infarct , age undetermined Abnormal ECG No previous ECGs available Confirmed by Belva Crome [phone]) on 07/31/2023 10:01:44 AM     Recent Labs: No results found for requested labs within last 365  days.  Recent Lipid Panel No results found for: "CHOL", "TRIG", "HDL", "CHOLHDL", "VLDL", "LDLCALC", "LDLDIRECT"  Physical Exam:    VS:  BP 114/70   Pulse 74   Ht 5\' 3"  (1.6 m)   Wt 228 lb (103.4 kg)   SpO2 98%   BMI 40.39 kg/m     Wt Readings from Last 3 Encounters:  07/31/23 228 lb (103.4 kg)  11/15/20 197 lb 1.6 oz (89.4 kg)  11/08/20 200 lb (90.7 kg)     GEN: Patient is in no acute distress HEENT: Normal NECK: No JVD; No carotid bruits LYMPHATICS: No lymphadenopathy  CARDIAC: S1 S2 regular, 2/6 systolic murmur at the apex. RESPIRATORY:  Clear to auscultation without rales, wheezing or rhonchi  ABDOMEN: Soft, non-tender, non-distended MUSCULOSKELETAL:  No edema; No deformity  SKIN: Warm and dry NEUROLOGIC:  Alert and oriented x 3 PSYCHIATRIC:  Normal affect    Signed, Garwin Brothers, MD  07/31/2023 10:21 AM    [facility]

## 2023-07-31 NOTE — Patient Instructions (Signed)
Medication Instructions:  Your physician recommends that you continue on your current medications as directed. Please refer to the Current Medication list given to you today.  *If you need a refill on your cardiac medications before your next appointment, please call your pharmacy*   Lab Work: None ordered If you have labs (blood work) drawn today and your tests are completely normal, you will receive your results only by: MyChart Message (if you have MyChart) OR A paper copy in the mail If you have any lab test that is abnormal or we need to change your treatment, we will call you to review the results.   Testing/Procedures: Your physician has requested that you have an echocardiogram. Echocardiography is a painless test that uses sound waves to create images of your heart. It provides your doctor with information about the size and shape of your heart and how well your heart's chambers and valves are working. This procedure takes approximately one hour. There are no restrictions for this procedure. Please do NOT wear cologne, perfume, aftershave, or lotions (deodorant is allowed). Please arrive 15 minutes prior to your appointment time.     Follow-Up: At [facility], you and your health needs are our priority.  As part of our continuing mission to provide you with exceptional heart care, we have created designated Provider Care Teams.  These Care Teams include your primary Cardiologist (physician) and Advanced Practice Providers (APPs -  Physician Assistants and Nurse Practitioners) who all work together to provide you with the care you need, when you need it.  We recommend signing up for the patient portal called "MyChart".  Sign up information is provided on this After Visit Summary.  MyChart is used to connect with patients for Virtual Visits (Telemedicine).  Patients are able to view lab/test results, encounter notes, upcoming appointments, etc.  Non-urgent messages can be sent to  your provider as well.   To learn more about what you can do with MyChart, go to [email or web address].    Your next appointment:   9 month(s)  The format for your next appointment:   In Person  Provider:   Rajan Revankar, MD   Other Instructions Echocardiogram An echocardiogram is a test that uses sound waves (ultrasound) to produce images of the heart. Images from an echocardiogram can provide important information about: Heart size and shape. The size and thickness and movement of your heart's walls. Heart muscle function and strength. Heart valve function or if you have stenosis. Stenosis is when the heart valves are too narrow. If blood is flowing backward through the heart valves (regurgitation). A tumor or infectious growth around the heart valves. Areas of heart muscle that are not working well because of poor blood flow or injury from a heart attack. Aneurysm detection. An aneurysm is a weak or damaged part of an artery wall. The wall bulges out from the normal force of blood pumping through the body. Tell a health care provider about: Any allergies you have. All medicines you are taking, including vitamins, herbs, eye drops, creams, and over-the-counter medicines. Any blood disorders you have. Any surgeries you have had. Any medical conditions you have. Whether you are pregnant or may be pregnant. What are the risks? Generally, this is a safe test. However, problems may occur, including an allergic reaction to dye (contrast) that may be used during the test. What happens before the test? No specific preparation is needed. You may eat and drink normally. What happens during the test? You will   take off your clothes from the waist up and put on a hospital gown. Electrodes or electrocardiogram (ECG)patches may be placed on your chest. The electrodes or patches are then connected to a device that monitors your heart rate and rhythm. You will lie down on a table for an  ultrasound exam. A gel will be applied to your chest to help sound waves pass through your skin. A handheld device, called a transducer, will be pressed against your chest and moved over your heart. The transducer produces sound waves that travel to your heart and bounce back (or "echo" back) to the transducer. These sound waves will be captured in real-time and changed into images of your heart that can be viewed on a video monitor. The images will be recorded on a computer and reviewed by your health care provider. You may be asked to change positions or hold your breath for a short time. This makes it easier to get different views or better views of your heart. In some cases, you may receive contrast through an IV in one of your veins. This can improve the quality of the pictures from your heart. The procedure may vary among health care providers and hospitals.   What can I expect after the test? You may return to your normal, everyday life, including diet, activities, and medicines, unless your health care provider tells you not to do that. Follow these instructions at home: It is up to you to get the results of your test. Ask your health care provider, or the department that is doing the test, when your results will be ready. Keep all follow-up visits. This is important. Summary An echocardiogram is a test that uses sound waves (ultrasound) to produce images of the heart. Images from an echocardiogram can provide important information about the size and shape of your heart, heart muscle function, heart valve function, and other possible heart problems. You do not need to do anything to prepare before this test. You may eat and drink normally. After the echocardiogram is completed, you may return to your normal, everyday life, unless your health care provider tells you not to do that. This information is not intended to replace advice given to you by your health care provider. Make sure you  discuss any questions you have with your health care provider. Document Revised: 07/04/2020 Document Reviewed: 07/04/2020 [employer] Patient Education  2021 [employer]   Important Information About Sugar        

## 2023-09-02 ENCOUNTER — Ambulatory Visit: Payer: 59 | Attending: Cardiology

## 2023-09-02 DIAGNOSIS — R0602 Shortness of breath: Secondary | ICD-10-CM | POA: Diagnosis not present

## 2023-09-02 DIAGNOSIS — R011 Cardiac murmur, unspecified: Secondary | ICD-10-CM | POA: Diagnosis not present

## 2023-09-02 LAB — ECHOCARDIOGRAM COMPLETE
Area-P 1/2: 3.08 cm2
MV M vel: 5.24 m/s
MV Peak grad: 109.8 mm[Hg]
Radius: 0.35 cm
S' Lateral: 3.2 cm

## 2023-09-10 ENCOUNTER — Encounter: Payer: Self-pay | Admitting: Cardiology
